# Patient Record
Sex: Female | Born: 1960 | Race: White | Hispanic: No | Marital: Single | Smoking: Never smoker
Health system: Southern US, Community
[De-identification: ages and names within clinical notes are randomized; demographics above are authoritative.]

---

## 2019-07-04 ENCOUNTER — Other Ambulatory Visit: Payer: Self-pay | Admitting: Medical Oncology

## 2019-07-04 DIAGNOSIS — Z1231 Encounter for screening mammogram for malignant neoplasm of breast: Secondary | ICD-10-CM

## 2019-07-19 ENCOUNTER — Encounter (INDEPENDENT_AMBULATORY_CARE_PROVIDER_SITE_OTHER): Payer: Self-pay

## 2019-07-19 ENCOUNTER — Ambulatory Visit
Admission: RE | Admit: 2019-07-19 | Discharge: 2019-07-19 | Disposition: A | Discharge status: Home / Self Care | Payer: No Typology Code available for payment source | Source: Ambulatory Visit | Attending: Medical Oncology | Admitting: Medical Oncology

## 2019-07-19 ENCOUNTER — Other Ambulatory Visit: Payer: Self-pay

## 2019-07-19 DIAGNOSIS — Z1231 Encounter for screening mammogram for malignant neoplasm of breast: Secondary | ICD-10-CM | POA: Diagnosis not present

## 2019-07-31 ENCOUNTER — Other Ambulatory Visit: Payer: Self-pay | Admitting: Medical Oncology

## 2019-07-31 DIAGNOSIS — R921 Mammographic calcification found on diagnostic imaging of breast: Secondary | ICD-10-CM

## 2019-07-31 DIAGNOSIS — R928 Other abnormal and inconclusive findings on diagnostic imaging of breast: Secondary | ICD-10-CM

## 2019-08-08 ENCOUNTER — Ambulatory Visit
Admission: RE | Admit: 2019-08-08 | Discharge: 2019-08-08 | Disposition: A | Discharge status: Home / Self Care | Payer: Managed Care, Other (non HMO) | Source: Ambulatory Visit | Attending: Medical Oncology | Admitting: Medical Oncology

## 2019-08-08 DIAGNOSIS — R928 Other abnormal and inconclusive findings on diagnostic imaging of breast: Secondary | ICD-10-CM

## 2019-08-08 DIAGNOSIS — R921 Mammographic calcification found on diagnostic imaging of breast: Secondary | ICD-10-CM

## 2019-08-09 ENCOUNTER — Other Ambulatory Visit: Payer: Self-pay | Admitting: Medical Oncology

## 2019-08-09 DIAGNOSIS — R921 Mammographic calcification found on diagnostic imaging of breast: Secondary | ICD-10-CM

## 2019-08-09 DIAGNOSIS — R928 Other abnormal and inconclusive findings on diagnostic imaging of breast: Secondary | ICD-10-CM

## 2019-08-21 ENCOUNTER — Ambulatory Visit
Admission: RE | Admit: 2019-08-21 | Discharge: 2019-08-21 | Disposition: A | Discharge status: Home / Self Care | Payer: No Typology Code available for payment source | Source: Ambulatory Visit | Attending: Medical Oncology | Admitting: Medical Oncology

## 2019-08-21 DIAGNOSIS — R928 Other abnormal and inconclusive findings on diagnostic imaging of breast: Secondary | ICD-10-CM | POA: Insufficient documentation

## 2019-08-21 DIAGNOSIS — R921 Mammographic calcification found on diagnostic imaging of breast: Secondary | ICD-10-CM | POA: Diagnosis present

## 2019-08-21 HISTORY — PX: BREAST BIOPSY: SHX20

## 2019-08-22 LAB — SURGICAL PATHOLOGY

## 2020-02-04 ENCOUNTER — Other Ambulatory Visit: Payer: Self-pay | Admitting: Medical Oncology

## 2020-02-04 DIAGNOSIS — R928 Other abnormal and inconclusive findings on diagnostic imaging of breast: Secondary | ICD-10-CM

## 2020-02-21 ENCOUNTER — Ambulatory Visit
Admission: RE | Admit: 2020-02-21 | Discharge: 2020-02-21 | Disposition: A | Discharge status: Home / Self Care | Payer: No Typology Code available for payment source | Source: Ambulatory Visit | Attending: Medical Oncology | Admitting: Medical Oncology

## 2020-02-21 DIAGNOSIS — R928 Other abnormal and inconclusive findings on diagnostic imaging of breast: Secondary | ICD-10-CM

## 2020-07-23 ENCOUNTER — Other Ambulatory Visit: Payer: Self-pay | Admitting: Medical Oncology

## 2020-07-23 DIAGNOSIS — R928 Other abnormal and inconclusive findings on diagnostic imaging of breast: Secondary | ICD-10-CM

## 2020-08-19 ENCOUNTER — Other Ambulatory Visit: Payer: Self-pay

## 2020-08-19 ENCOUNTER — Ambulatory Visit
Admission: RE | Admit: 2020-08-19 | Discharge: 2020-08-19 | Disposition: A | Discharge status: Home / Self Care | Payer: No Typology Code available for payment source | Source: Ambulatory Visit | Attending: Medical Oncology | Admitting: Medical Oncology

## 2020-08-19 DIAGNOSIS — R928 Other abnormal and inconclusive findings on diagnostic imaging of breast: Secondary | ICD-10-CM | POA: Insufficient documentation

## 2021-07-28 ENCOUNTER — Other Ambulatory Visit: Payer: Self-pay | Admitting: Family Medicine

## 2021-07-28 DIAGNOSIS — R921 Mammographic calcification found on diagnostic imaging of breast: Secondary | ICD-10-CM

## 2021-08-25 ENCOUNTER — Other Ambulatory Visit: Payer: No Typology Code available for payment source

## 2021-09-04 ENCOUNTER — Ambulatory Visit
Admission: RE | Admit: 2021-09-04 | Discharge: 2021-09-04 | Disposition: A | Discharge status: Home / Self Care | Payer: No Typology Code available for payment source | Source: Ambulatory Visit | Attending: Family Medicine | Admitting: Family Medicine

## 2021-09-04 ENCOUNTER — Other Ambulatory Visit: Payer: Self-pay

## 2021-09-04 DIAGNOSIS — R921 Mammographic calcification found on diagnostic imaging of breast: Secondary | ICD-10-CM

## 2022-02-17 ENCOUNTER — Other Ambulatory Visit: Payer: Self-pay | Admitting: Internal Medicine

## 2022-02-17 DIAGNOSIS — I208 Other forms of angina pectoris: Secondary | ICD-10-CM

## 2022-02-25 ENCOUNTER — Ambulatory Visit
Admission: RE | Admit: 2022-02-25 | Discharge: 2022-02-25 | Disposition: A | Discharge status: Home / Self Care | Payer: No Typology Code available for payment source | Source: Ambulatory Visit | Attending: Internal Medicine | Admitting: Internal Medicine

## 2022-02-25 DIAGNOSIS — I208 Other forms of angina pectoris: Secondary | ICD-10-CM | POA: Insufficient documentation

## 2022-09-10 ENCOUNTER — Encounter: Payer: Self-pay | Admitting: Emergency Medicine

## 2022-09-10 ENCOUNTER — Ambulatory Visit
Admission: EM | Admit: 2022-09-10 | Discharge: 2022-09-10 | Disposition: A | Discharge status: Home / Self Care | Payer: No Typology Code available for payment source | Attending: Internal Medicine | Admitting: Internal Medicine

## 2022-09-10 DIAGNOSIS — J0100 Acute maxillary sinusitis, unspecified: Secondary | ICD-10-CM

## 2022-09-10 MED ORDER — PREDNISONE 10 MG PO TABS: ORAL_TABLET | ORAL | 0 refills | Status: AC

## 2022-09-10 MED ORDER — AMOXICILLIN 500 MG PO CAPS: 500.0000 mg | ORAL_CAPSULE | Freq: Three times a day (TID) | ORAL | 0 refills | Status: AC

## 2022-09-10 NOTE — ED Provider Notes (Signed)
[facility]    CSN: [id number] Arrival date & time: 09/10/22  1626      History   Chief Complaint Chief Complaint  Patient presents with   Headache   Dental Pain   Sinus Problem    HPI Nancy Hernandez is a 61 y.o. female presents to UC today with complaint of right side sinus pressure and dental pain.  This started 5 days ago.  She denies headache, runny nose, nasal congestion, ear pain, sore throat, cough or shortness of breath.  She denies fever, chills or body aches.  She has tried an antihistamine, Flonase and a Nettie pot with minimal relief of symptoms.  She has not had sick contacts that she is aware of.  She reports she recently traveled to [place] and is not sure if the change in the barometric pressure contributed to her symptoms.  HPI  History reviewed. No pertinent past medical history.  There are no problems to display for this patient.   Past Surgical History:  Procedure Laterality Date   BREAST BIOPSY Right 08/21/2019   affirm bx of calcs, x marker, BENIGN MAMMARY PARENCHYMA WITH FIBROCYSTIC AND PAPILLARY APOCRINE     OB History   No obstetric history on file.      Home Medications    Prior to Admission medications   Medication Sig Start Date End Date Taking? Authorizing Provider  amoxicillin (AMOXIL) 500 MG capsule Take 1 capsule (500 mg total) by mouth 3 (three) times daily. 09/10/22  Yes Isiaih Hollenbach, Salvadore Oxford, NP  predniSONE (DELTASONE) 10 MG tablet Take 6 tabs on day 1, 5 tabs on day 2, 4 tabs on day 3, 3 tabs on day 4, 2 tabs on day 5, 1 tab on day 6 09/10/22  Yes Diedre Maclellan, Salvadore Oxford, NP    Family History Family History  Problem Relation Age of Onset   Breast cancer Neg Hx     Social History Social History   Tobacco Use   Smoking status: Never   Smokeless tobacco: Never  Vaping Use   Vaping Use: Never used  Substance Use Topics   Alcohol use: Yes   Drug use: Never     Allergies   Patient has no known  allergies.   Review of Systems Review of Systems   Constitutional: Denies fever, malaise, fatigue, headache or abrupt weight changes.  HEENT: Patient reports right-sided sinus pressure and dental pain.  Denies eye pain, eye redness, ear pain, ringing in the ears, wax buildup, runny nose, nasal congestion, bloody nose, or sore throat. Respiratory: Denies difficulty breathing, shortness of breath, cough or sputum production.   Cardiovascular: Denies chest pain, chest tightness, palpitations or swelling in the hands or feet.  Gastrointestinal: Denies abdominal pain, bloating, constipation, diarrhea or blood in the stool.   No other specific complaints in a complete review of systems (except as listed in HPI above).  Physical Exam Triage Vital Signs ED Triage Vitals  Enc Vitals Group     BP 09/10/22 1646 (!) 158/85     Pulse Rate 09/10/22 1646 90     Resp 09/10/22 1646 14     Temp 09/10/22 1646 98.3 F (36.8 C)     Temp Source 09/10/22 1646 Oral     SpO2 09/10/22 1646 100 %     Weight 09/10/22 1645 145 lb (65.8 kg)     Height 09/10/22 1645 5\' 5"  (1.651 m)     Head Circumference --      Peak Flow --  Pain Score 09/10/22 1644 10     Pain Loc --      Pain Edu? --      Excl. in GC? --    No data found.  Updated Vital Signs BP (!) 158/85 (BP Location: Left Arm)   Pulse 90   Temp 98.3 F (36.8 C) (Oral)   Resp 14   Ht 5\' 5"  (1.651 m)   Wt 145 lb (65.8 kg)   SpO2 100%   BMI 24.13 kg/m      Physical Exam  BP (!) 158/85 (BP Location: Left Arm)   Pulse 90   Temp 98.3 F (36.8 C) (Oral)   Resp 14   Ht 5\' 5"  (1.651 m)   Wt 145 lb (65.8 kg)   SpO2 100%   BMI 24.13 kg/m  Wt Readings from Last 3 Encounters:  09/10/22 145 lb (65.8 kg)    General: Appears her stated age, well developed, well nourished in NAD. Skin: Warm, dry and intact.  HEENT: Head: normal shape and size, right-sided maxillary sinus tenderness noted; Eyes: sclera white, no icterus, conjunctiva  pink, PERRLA and EOMs intact; Ears: Tm's gray and intact, normal light reflex; Nose: mucosa pink and moist, septum midline; Throat/Mouth: Teeth present, slight redness of the right upper hard palate. Neck: No adenopathy noted. Cardiovascular: Normal rate and rhythm. S1,S2 noted.  No murmur, rubs or gallops noted.  Pulmonary/Chest: Normal effort and positive vesicular breath sounds. No respiratory distress. No wheezes, rales or ronchi noted.   Neurological: Alert and oriented.    UC Treatments / Results   Medications Ordered in UC Meds ordered this encounter  Medications   predniSONE (DELTASONE) 10 MG tablet    Sig: Take 6 tabs on day 1, 5 tabs on day 2, 4 tabs on day 3, 3 tabs on day 4, 2 tabs on day 5, 1 tab on day 6    Dispense:  21 tablet    Refill:  0    Order Specific Question:   Supervising Provider    Answer:   09/12/22   amoxicillin (AMOXIL) 500 MG capsule    Sig: Take 1 capsule (500 mg total) by mouth 3 (three) times daily.    Dispense:  30 capsule    Refill:  0    Order Specific Question:   Supervising Provider    Answer:   Merrilee Jansky [[id number]]     Initial Impression / Assessment and Plan / UC Course  I have reviewed the triage vital signs and the nursing notes.  Pertinent labs & imaging results that were available during my care of the patient were reviewed by me and considered in my medical decision making (see chart for details).     61 year old female with complaint of sinus pressure and dental pain.  Exam consistent with likely viral sinusitis, possible early dental abscess although no broken teeth identified.  We will treat symptoms with Pred taper x6 days.  We will also give her Rx for Amoxicillin 500 mg TID x10 days to start on Sunday if symptoms worsen.  She already has a follow-up appointment with her PCP on Monday.  Final Clinical Impressions(s) / UC Diagnoses   Final diagnoses:  None     Discharge Instructions      You  were seen today for sinus pressure and dental pain.  Your exam is consistent with viral sinusitis versus possible early dental abscess versus bacterial sinusitis.  I have sent in prednisone to help  with the pain and inflammation.  I want you to start the antibiotic that I have sent in on Sunday if your symptoms worsen.  Please follow-up with your PCP as previously scheduled.     ED Prescriptions     Medication Sig Dispense Auth. Provider   predniSONE (DELTASONE) 10 MG tablet Take 6 tabs on day 1, 5 tabs on day 2, 4 tabs on day 3, 3 tabs on day 4, 2 tabs on day 5, 1 tab on day 6 21 tablet Tomothy Eddins, Salvadore Oxford, NP   amoxicillin (AMOXIL) 500 MG capsule Take 1 capsule (500 mg total) by mouth 3 (three) times daily. 30 capsule Lorre Munroe, NP      PDMP not reviewed this encounter.   Lorre Munroe, NP 09/10/22 1700

## 2022-09-10 NOTE — Discharge Instructions (Signed)
You were seen today for sinus pressure and dental pain.  Your exam is consistent with viral sinusitis versus possible early dental abscess versus bacterial sinusitis.  I have sent in prednisone to help with the pain and inflammation.  I want you to start the antibiotic that I have sent in on Sunday if your symptoms worsen.  Please follow-up with your PCP as previously scheduled.

## 2022-09-10 NOTE — ED Triage Notes (Signed)
Patient c/o headache, sinus pressure and pain for the past 5-6 days.  Patient denies fevers.

## 2023-06-23 IMAGING — MG DIGITAL DIAGNOSTIC BILAT W/ TOMO W/ CAD
6 of 10 series · 6 of 26 positions shown · non-contrast
Comparison: Previous exam(s).

CLINICAL DATA: Follow-up for right breast calcifications. These
were residual calcifications following biopsy of benign
calcifications, biopsy performed on 08/21/2019. No current breast
complaints.

EXAM:
DIGITAL DIAGNOSTIC BILATERAL MAMMOGRAM WITH TOMOSYNTHESIS AND CAD
TECHNIQUE: Bilateral digital diagnostic mammography and breast tomosynthesis
was performed. The images were evaluated with computer-aided
detection.

[R LM]
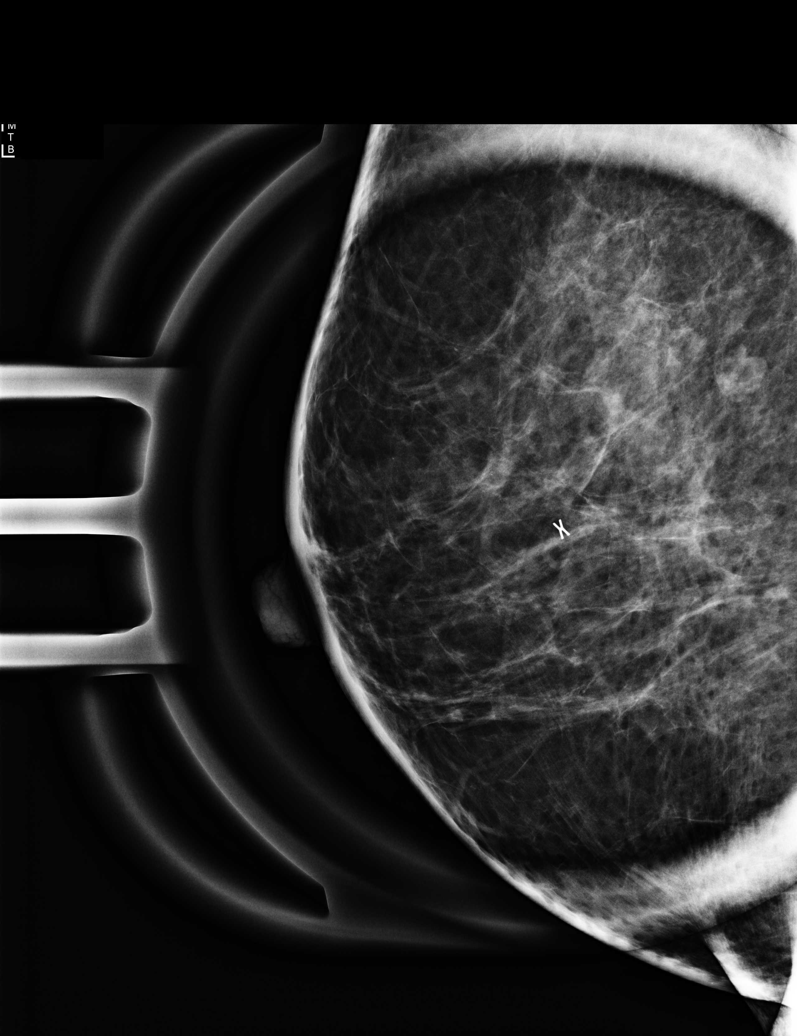

[R CC]
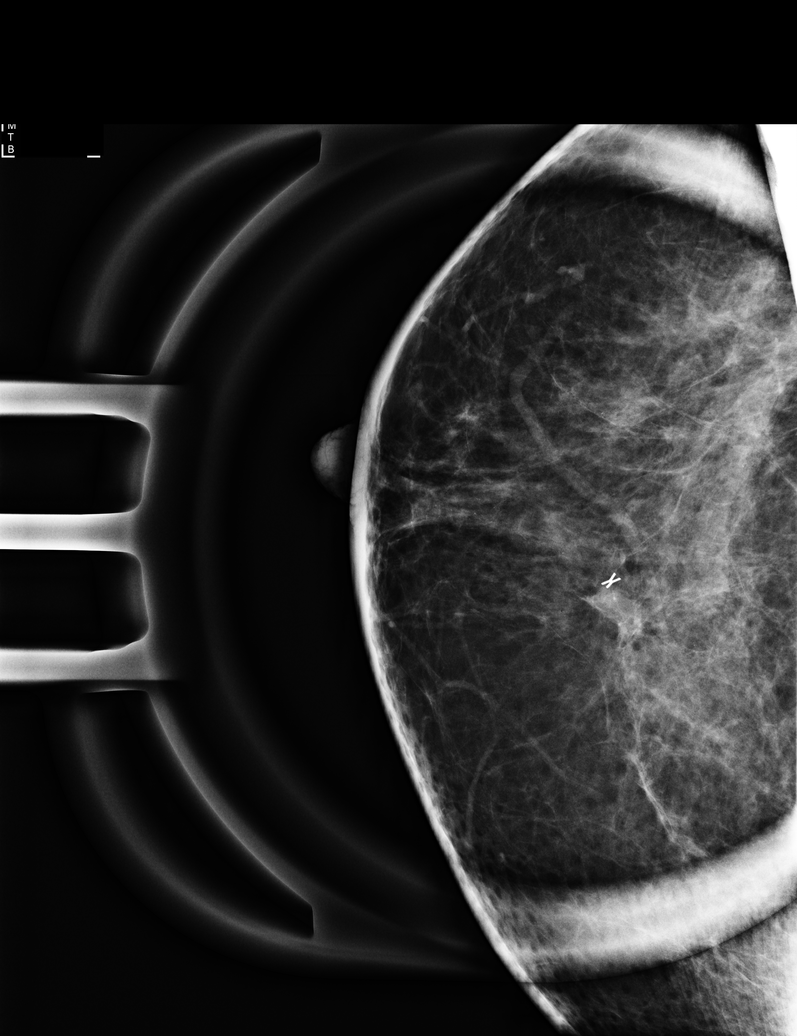

[L MLO synth-2D]
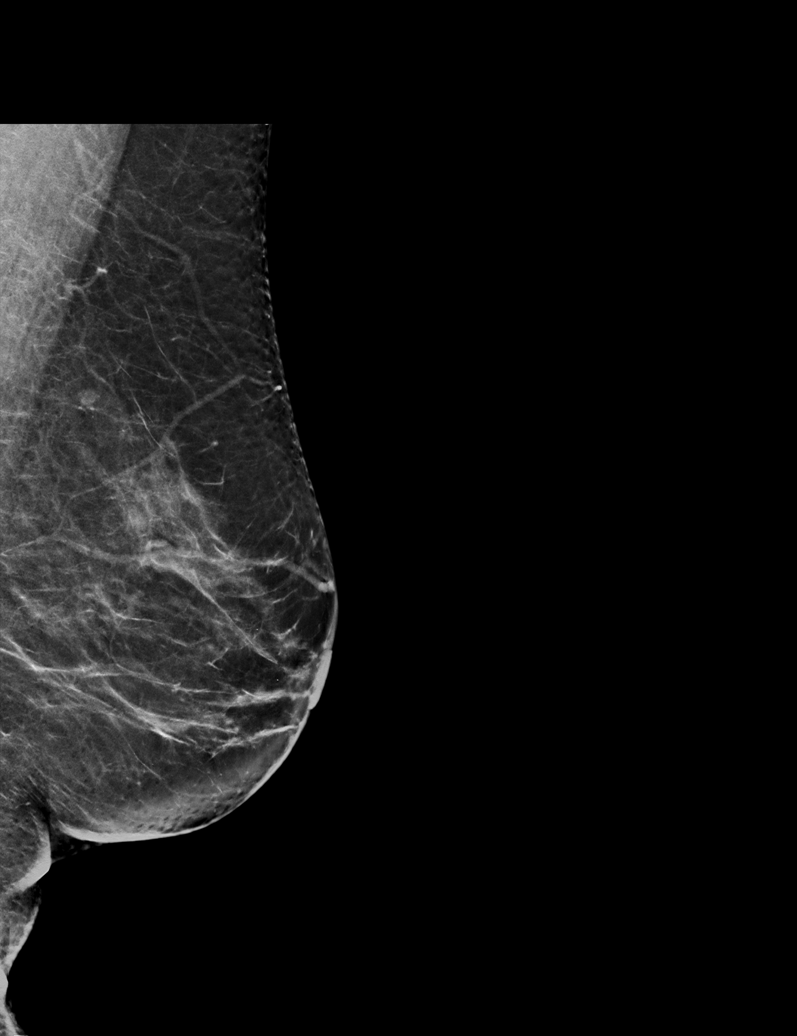

[R CC synth-2D]
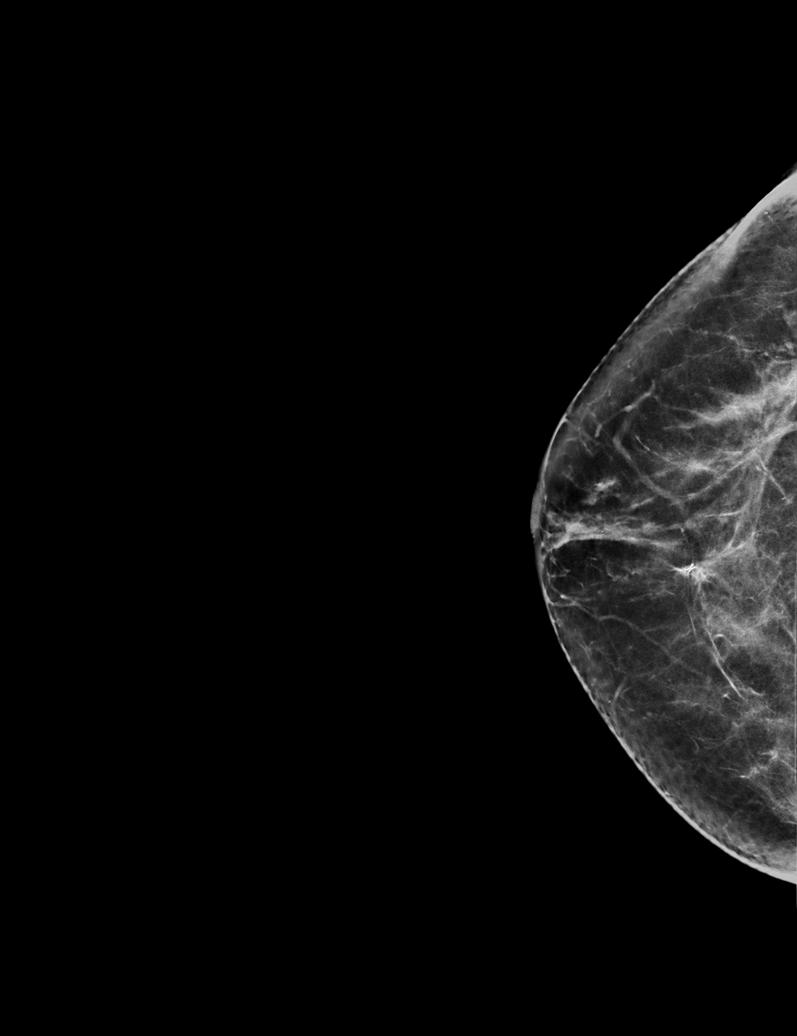

[R MLO synth-2D]
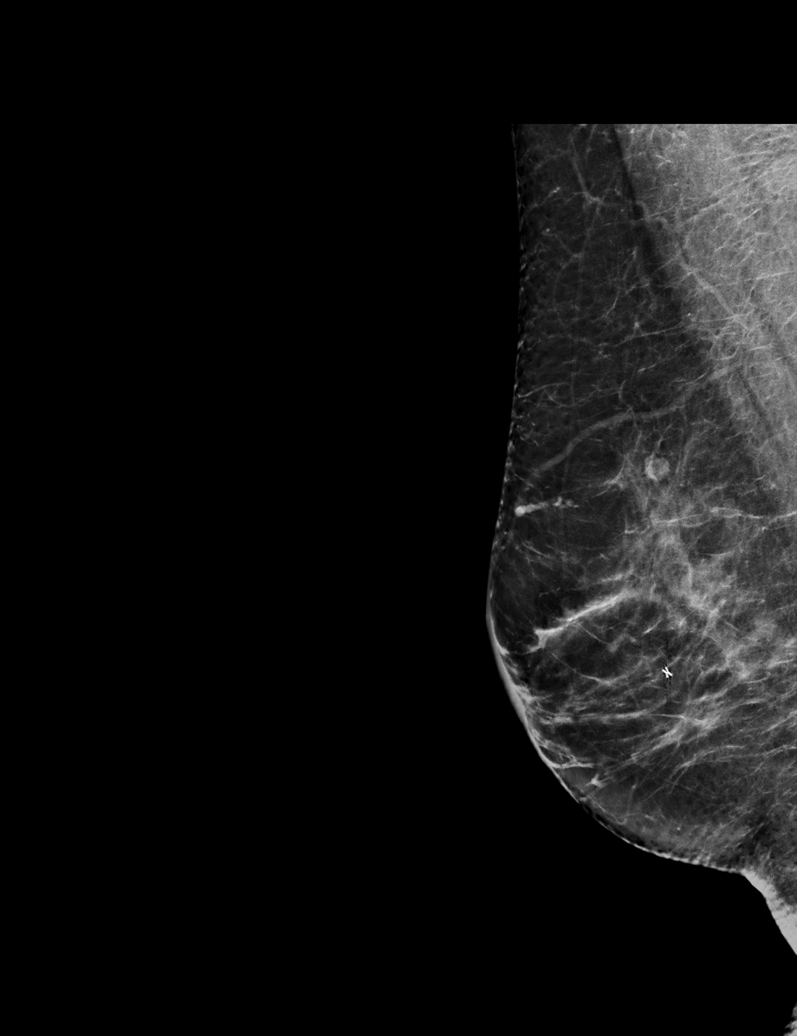

[L CC synth-2D]
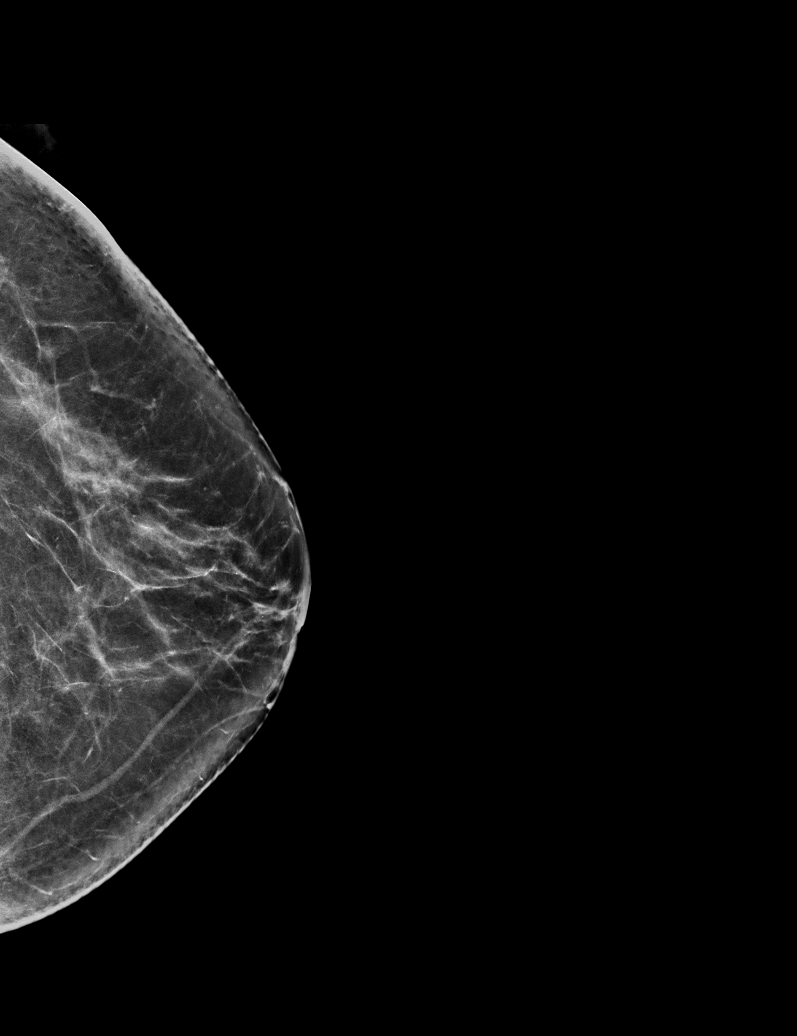

[6 of 26 positions shown; findings below may reference images not displayed]

ACR Breast Density Category c: The breast tissue is heterogeneously
dense, which may obscure small masses.
FINDINGS: Punctate calcifications in the anterior to middle upper right breast
are unchanged.

There are no masses, areas of architectural distortion, areas of
significant asymmetry or new or suspicious calcifications.
IMPRESSION: 1. No evidence of breast malignancy.
2. Benign right breast calcifications, stable for 2 years.

RECOMMENDATION:
Screening mammogram in one year.(Code:J0-8-CVC)

I have discussed the findings and recommendations with the patient.
If applicable, a reminder letter will be sent to the patient
regarding the next appointment.

BI-RADS CATEGORY  2: Benign.

## 2023-12-14 IMAGING — CT CT CARDIAC CORONARY ARTERY CALCIUM SCORE
1 of 2 series · 11 of 20 positions shown, 14 images · non-contrast
Comparison: No priors.
COMPARISON: No priors.

Addendum:
CLINICAL DATA: This over-read does not include interpretation of
cardiac or coronary anatomy or pathology. The coronary calcium score
interpretation by the cardiologist is attached.
CLINICAL DATA: Risk stratification

EXAM:
Coronary Calcium Score
TECHNIQUE: The patient was scanned on a Siemens [employer]atom go.Top Scanner. Axial
non-contrast 3 mm slices were carried out through the heart. The
data set was analyzed on a dedicated work station and scored using
the Agatson method.

[Series 2: casc 3.0 i36f bestdiast 72 % · axial · 0.39mm/px · z∈[+1264,+1400]mm · 11 of 109 slices shown, 14 images]
[im 10/109  vessel]
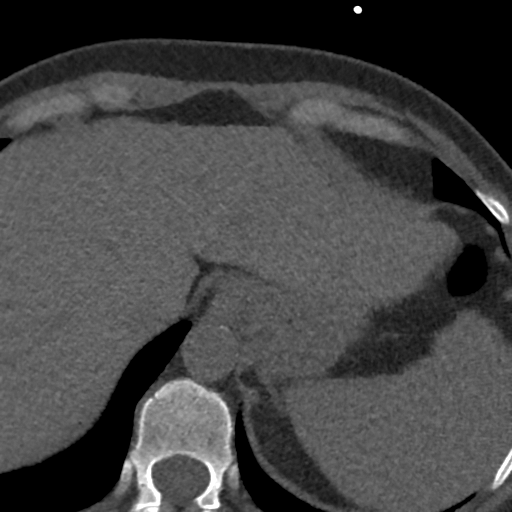
[im 10/109  lung]
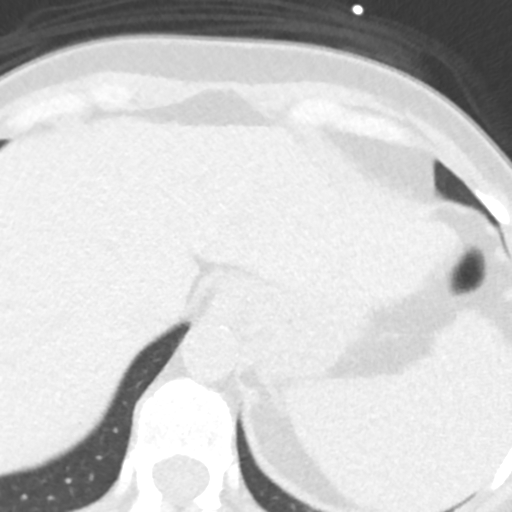
[im 19/109  vessel]
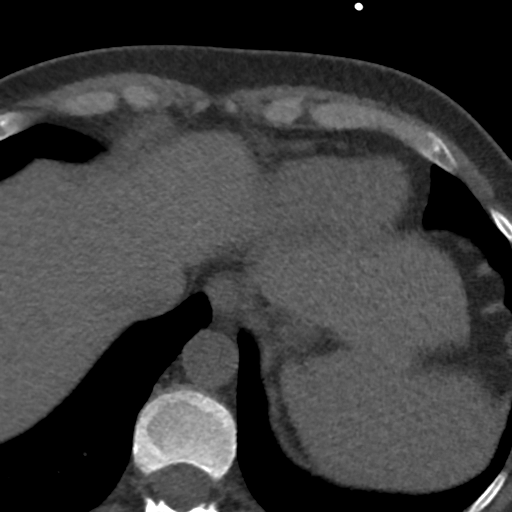
[im 28/109  vessel]
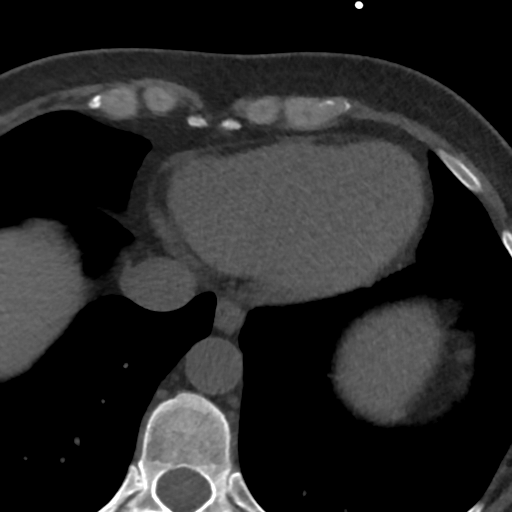
[im 37/109  vessel]
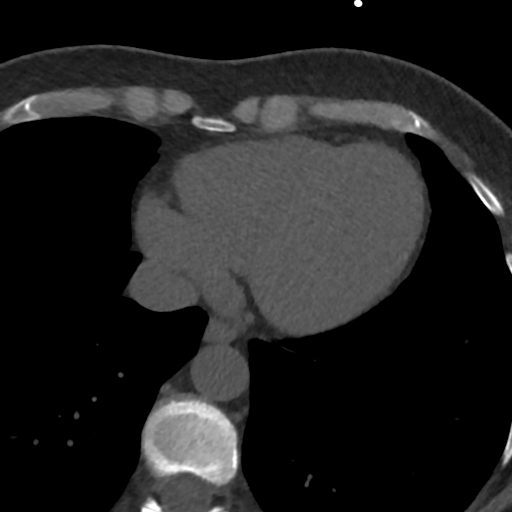
[im 46/109  vessel]
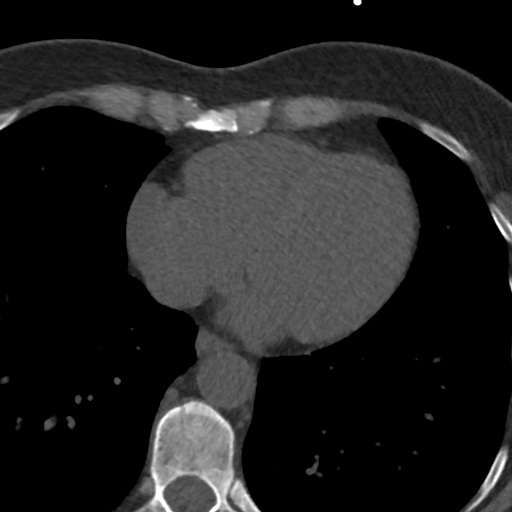
[im 46/109  lung]
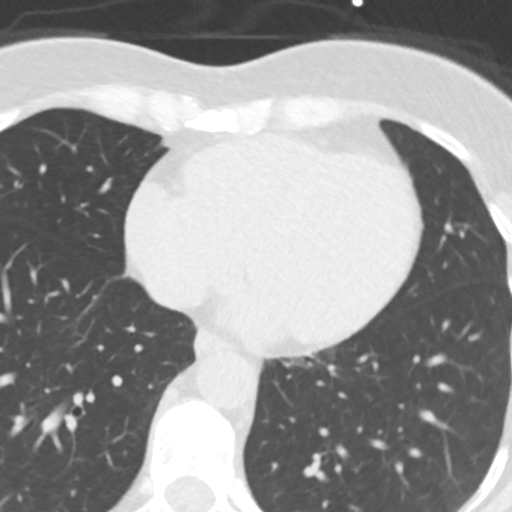
[im 55/109  vessel]
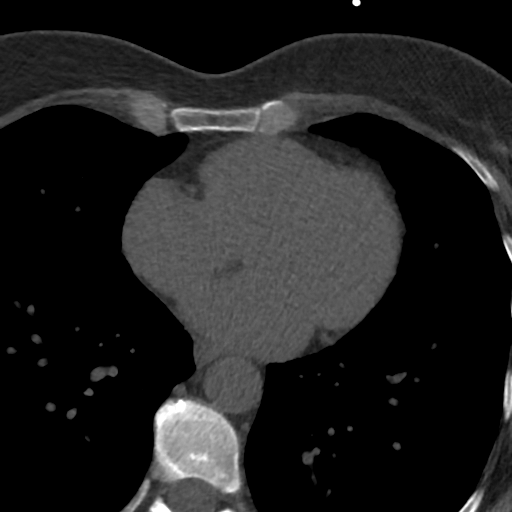
[im 64/109  vessel]
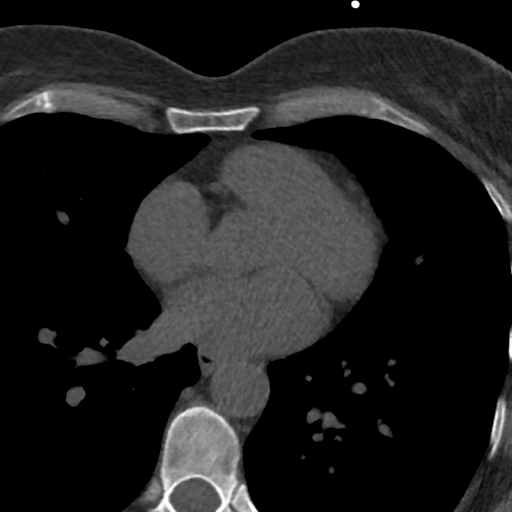
[im 73/109  vessel]
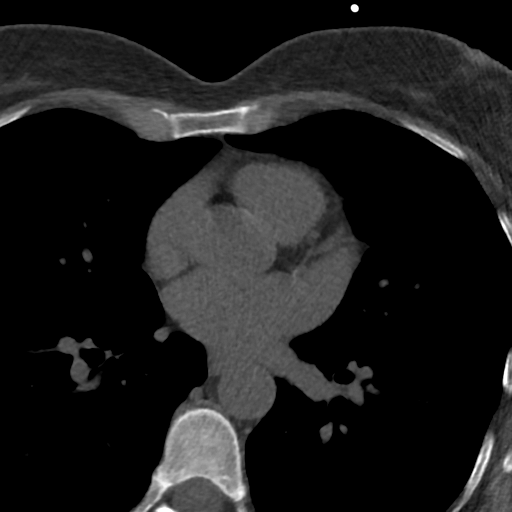
[im 82/109  vessel]
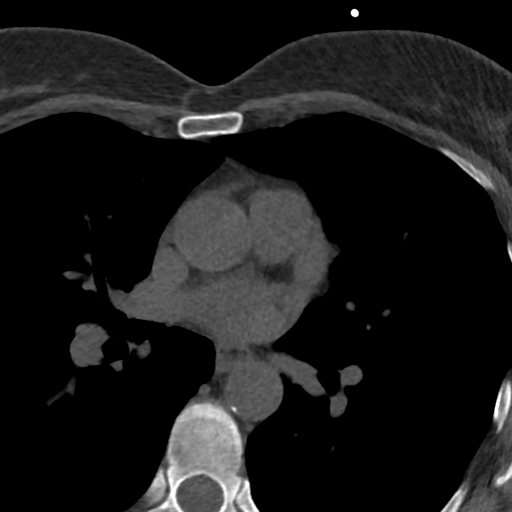
[im 82/109  lung]
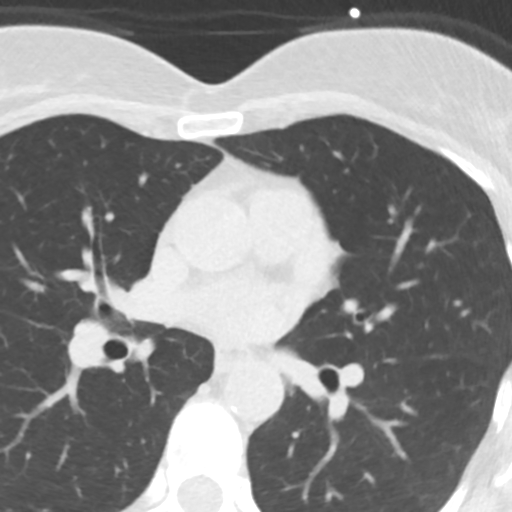
[im 91/109  vessel]
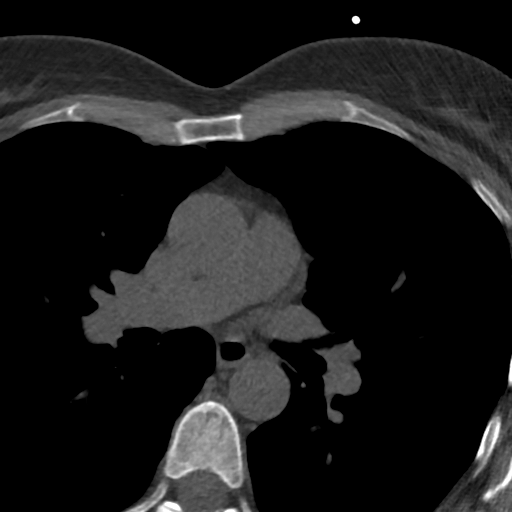
[im 100/109  vessel]
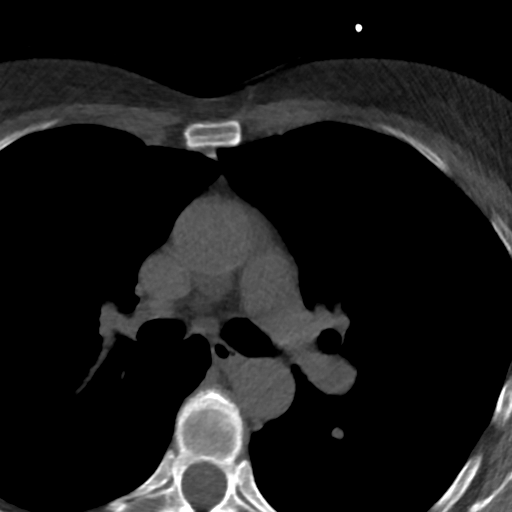

[11 of 20 positions shown; findings below may reference images not displayed]

FINDINGS: Atherosclerotic calcifications in the thoracic aorta. Within the
visualized portions of the thorax there are no suspicious appearing
pulmonary nodules or masses, there is no acute consolidative
airspace disease, no pleural effusions, no pneumothorax and no
lymphadenopathy. Visualized portions of the upper abdomen are
unremarkable. There are no aggressive appearing lytic or blastic
lesions noted in the visualized portions of the skeleton.
IMPRESSION: 1.  Aortic Atherosclerosis (XQPRH-47X.X).
FINDINGS: Non-cardiac: See separate report from [REDACTED].

Ascending Aorta: Normal size

Pericardium: Normal

Coronary arteries: Normal origin of left and right coronary
arteries. Distribution of arterial calcifications if present, as
noted below;

LM 0

LAD 0

LCx 0

RCA 0

Total 0

IMPRESSION AND RECOMMENDATION:
1. Normal coronary calcium score of 0. Patient is low risk for
coronary events.

2.  CAC 0, QUIRIJN AMAZIGH0.

3.  Continue heart healthy lifestyle and risk factor modification.

*** End of Addendum ***
FINDINGS: Atherosclerotic calcifications in the thoracic aorta. Within the
visualized portions of the thorax there are no suspicious appearing
pulmonary nodules or masses, there is no acute consolidative
airspace disease, no pleural effusions, no pneumothorax and no
lymphadenopathy. Visualized portions of the upper abdomen are
unremarkable. There are no aggressive appearing lytic or blastic
lesions noted in the visualized portions of the skeleton.
IMPRESSION: 1.  Aortic Atherosclerosis (XQPRH-47X.X).
